# Patient Record
Sex: Female | Born: 1965 | Race: White | Hispanic: No | Marital: Married | Smoking: Never smoker
Health system: Southern US, Community
[De-identification: ages and names within clinical notes are randomized; demographics above are authoritative.]

## PROBLEM LIST (undated history)

## (undated) DIAGNOSIS — I10 Essential (primary) hypertension: Secondary | ICD-10-CM

## (undated) DIAGNOSIS — E119 Type 2 diabetes mellitus without complications: Secondary | ICD-10-CM

## (undated) HISTORY — PX: OTHER SURGICAL HISTORY: SHX169

---

## 2015-05-07 DIAGNOSIS — M778 Other enthesopathies, not elsewhere classified: Secondary | ICD-10-CM | POA: Insufficient documentation

## 2015-08-26 DIAGNOSIS — K219 Gastro-esophageal reflux disease without esophagitis: Secondary | ICD-10-CM | POA: Insufficient documentation

## 2015-08-26 DIAGNOSIS — I10 Essential (primary) hypertension: Secondary | ICD-10-CM | POA: Insufficient documentation

## 2018-03-21 DIAGNOSIS — M19012 Primary osteoarthritis, left shoulder: Secondary | ICD-10-CM | POA: Insufficient documentation

## 2018-03-21 DIAGNOSIS — M5030 Other cervical disc degeneration, unspecified cervical region: Secondary | ICD-10-CM | POA: Insufficient documentation

## 2018-05-23 DIAGNOSIS — I499 Cardiac arrhythmia, unspecified: Secondary | ICD-10-CM | POA: Insufficient documentation

## 2018-05-23 DIAGNOSIS — E782 Mixed hyperlipidemia: Secondary | ICD-10-CM | POA: Insufficient documentation

## 2018-05-23 DIAGNOSIS — E1165 Type 2 diabetes mellitus with hyperglycemia: Secondary | ICD-10-CM | POA: Insufficient documentation

## 2018-05-23 DIAGNOSIS — Z6841 Body Mass Index (BMI) 40.0 and over, adult: Secondary | ICD-10-CM | POA: Insufficient documentation

## 2018-07-03 ENCOUNTER — Encounter (HOSPITAL_BASED_OUTPATIENT_CLINIC_OR_DEPARTMENT_OTHER): Payer: Self-pay | Admitting: Emergency Medicine

## 2018-07-03 ENCOUNTER — Emergency Department (HOSPITAL_BASED_OUTPATIENT_CLINIC_OR_DEPARTMENT_OTHER): Payer: Commercial Managed Care - PPO

## 2018-07-03 ENCOUNTER — Other Ambulatory Visit: Payer: Self-pay

## 2018-07-03 ENCOUNTER — Emergency Department (HOSPITAL_BASED_OUTPATIENT_CLINIC_OR_DEPARTMENT_OTHER)
Admission: EM | Admit: 2018-07-03 | Discharge: 2018-07-04 | Disposition: A | Discharge status: Home / Self Care | Payer: Commercial Managed Care - PPO | Attending: Emergency Medicine | Admitting: Emergency Medicine

## 2018-07-03 DIAGNOSIS — E119 Type 2 diabetes mellitus without complications: Secondary | ICD-10-CM | POA: Insufficient documentation

## 2018-07-03 DIAGNOSIS — R0789 Other chest pain: Secondary | ICD-10-CM | POA: Diagnosis not present

## 2018-07-03 DIAGNOSIS — I10 Essential (primary) hypertension: Secondary | ICD-10-CM | POA: Insufficient documentation

## 2018-07-03 HISTORY — DX: Essential (primary) hypertension: I10

## 2018-07-03 HISTORY — DX: Type 2 diabetes mellitus without complications: E11.9

## 2018-07-03 LAB — BASIC METABOLIC PANEL
Anion gap: 8 (ref 5–15)
BUN: 18 mg/dL (ref 6–20)
CO2: 23 mmol/L (ref 22–32)
Calcium: 9.1 mg/dL (ref 8.9–10.3)
Chloride: 105 mmol/L (ref 98–111)
Creatinine, Ser: 0.49 mg/dL (ref 0.44–1.00)
GFR calc non Af Amer: >60 mL/min (ref >60)
Glucose, Bld: 116 mg/dL — ABNORMAL HIGH (ref 70–99)
Potassium: 3.3 mmol/L — ABNORMAL LOW (ref 3.5–5.1)
Sodium: 136 mmol/L (ref 135–145)

## 2018-07-03 LAB — CBC
HCT: 41.3 % (ref 36.0–46.0)
Hemoglobin: 12.8 g/dL (ref 12.0–15.0)
MCH: 27.3 pg (ref 26.0–34.0)
MCHC: 31 g/dL (ref 30.0–36.0)
MCV: 88.1 fL (ref 80.0–100.0)
Platelets: 199 10*3/uL (ref 150–400)
RBC: 4.69 MIL/uL (ref 3.87–5.11)
RDW: 14.2 % (ref 11.5–15.5)
WBC: 6.3 10*3/uL (ref 4.0–10.5)
nRBC: 0 % (ref 0.0–0.2)

## 2018-07-03 LAB — TROPONIN I: Troponin I: <0.03 ng/mL (ref <0.03)

## 2018-07-03 LAB — PREGNANCY, URINE: Preg Test, Ur: NEGATIVE

## 2018-07-03 MED ORDER — SODIUM CHLORIDE 0.9% FLUSH
3.0000 mL | Freq: Once | INTRAVENOUS | Status: DC
  Filled 2018-07-03: qty 3

## 2018-07-03 NOTE — ED Provider Notes (Signed)
[facility] EMERGENCY DEPARTMENT Provider Note   CSN: [id number] Arrival date & time: 07/03/18  2134    History   Chief Complaint Chief Complaint  Patient presents with  . Chest Pain    HPI Suzanne Brown is a 53 y.o. female.     53 yo F with a cc of chest pain.  Going on for the past 2 days.  Comes and goes.  Not sure what makes it better or worse.  Later thinks maybe its worse with eating.  Radiates to the neck.  No sob.   Denies history of MI has a history of hypertension hyperlipidemia and diabetes denies smoking history denies family history of MI.  Denies history of PE or DVT denies recent surgery or immobilization denies estrogen use denies history of cancer denies hemoptysis.  The history is provided by the patient.  Chest Pain  Pain location:  Substernal area Pain quality: aching and pressure   Pain radiates to:  Neck Pain severity:  Moderate Onset quality:  Gradual Duration:  2 days Timing:  Constant Progression:  Worsening Chronicity:  New Relieved by:  Nothing Worsened by:  Nothing Ineffective treatments:  None tried Associated symptoms: no dizziness, no fever, no headache, no nausea, no palpitations, no shortness of breath and no vomiting     Past Medical History:  Diagnosis Date  . Diabetes mellitus without complication (HCC)   . Hypertension     There are no active problems to display for this patient.   Past Surgical History:  Procedure Laterality Date  . c cection       OB History   No obstetric history on file.      Home Medications    Prior to Admission medications   Not on File    Family History History reviewed. No pertinent family history.  Social History Social History   Tobacco Use  . Smoking status: Never Smoker  . Smokeless tobacco: Never Used  Substance Use Topics  . Alcohol use: Never    Frequency: Never  . Drug use: Never     Allergies   Sulfa antibiotics   Review of Systems Review of Systems   Constitutional: Negative for chills and fever.  HENT: Negative for congestion and rhinorrhea.   Eyes: Negative for redness and visual disturbance.  Respiratory: Negative for shortness of breath and wheezing.   Cardiovascular: Positive for chest pain. Negative for palpitations.  Gastrointestinal: Negative for nausea and vomiting.  Genitourinary: Negative for dysuria and urgency.  Musculoskeletal: Negative for arthralgias and myalgias.  Skin: Negative for pallor and wound.  Neurological: Negative for dizziness and headaches.     Physical Exam Updated Vital Signs BP 140/85   Pulse 73   Temp 98 F (36.7 C) (Oral)   Resp 19   Ht 5\' 5"  (1.651 m)   Wt 113.4 kg   SpO2 97%   BMI 41.60 kg/m   Physical Exam Vitals signs and nursing note reviewed.  Constitutional:      General: She is not in acute distress.    Appearance: She is well-developed. She is not diaphoretic.  HENT:     Head: Normocephalic and atraumatic.  Eyes:     Pupils: Pupils are equal, round, and reactive to light.  Neck:     Musculoskeletal: Normal range of motion and neck supple.  Cardiovascular:     Rate and Rhythm: Normal rate and regular rhythm.     Heart sounds: No murmur. No friction rub. No gallop.  Pulmonary:     Effort: Pulmonary effort is normal.     Breath sounds: No wheezing or rales.  Abdominal:     General: There is no distension.     Palpations: Abdomen is soft.     Tenderness: There is no abdominal tenderness.  Musculoskeletal:        General: No tenderness.  Skin:    General: Skin is warm and dry.  Neurological:     Mental Status: She is alert and oriented to person, place, and time.  Psychiatric:        Behavior: Behavior normal.      ED Treatments / Results  Labs (all labs ordered are listed, but only abnormal results are displayed) Labs Reviewed  BASIC METABOLIC PANEL - Abnormal; Notable for the following components:      Result Value   Potassium 3.3 (*)    Glucose, Bld 116  (*)    All other components within normal limits  CBC  TROPONIN I  PREGNANCY, URINE  TROPONIN I    EKG EKG Interpretation  Date/Time:  Monday July 03 2018 21:41:53 EST Ventricular Rate:  82 PR Interval:  164 QRS Duration: 78 QT Interval:  382 QTC Calculation: 446 R Axis:   66 Text Interpretation:  Normal sinus rhythm Nonspecific T wave abnormality Abnormal ECG Confirmed by Geoffery Lyons ([id number]) on 07/03/2018 10:25:39 PM   Radiology Dg Chest 2 View  Result Date: 07/03/2018 CLINICAL DATA:  Initial evaluation for acute chest pain. EXAM: CHEST - 2 VIEW COMPARISON:  None. FINDINGS: Mild cardiomegaly.  Mediastinal silhouette normal. Lungs normally inflated. Mild linear atelectasis and/or scarring within the mid and lower left lung. No focal infiltrates. No edema or effusion. No pneumothorax. No acute osseous finding. IMPRESSION: 1. Minimal subsegmental atelectasis and/or scarring within the mid and lower left lung. 2. No other active cardiopulmonary disease. Electronically Signed   By: Rise Mu M.D.   On: 07/03/2018 22:07    Procedures Procedures (including critical care time)  Medications Ordered in ED Medications  sodium chloride flush (NS) 0.9 % injection 3 mL (3 mLs Intravenous Not Given 07/03/18 2222)     Initial Impression / Assessment and Plan / ED Course  I have reviewed the triage vital signs and the nursing notes.  Pertinent labs & imaging results that were available during my care of the patient were reviewed by me and considered in my medical decision making (see chart for details).        53 yo F with a chief complaint of chest pain.  Atypical in nature going on for the past 2 or 3 days.  Initial troponin is negative EKG is unremarkable.  Chest x-ray reviewed by me without focal infiltrate.  She has very mild hypokalemia at 3.3.  Her delta troponin is negative.  Patient continues to feel well.  Discharge home.  2:13 AM:  I have discussed the  diagnosis/risks/treatment options with the patient and believe the pt to be eligible for discharge home to follow-up with PCP. We also discussed returning to the ED immediately if new or worsening sx occur. We discussed the sx which are most concerning (e.g., sudden worsening pain, fever, inability to tolerate by mouth) that necessitate immediate return. Medications administered to the patient during their visit and any new prescriptions provided to the patient are listed below.  Medications given during this visit Medications  sodium chloride flush (NS) 0.9 % injection 3 mL (3 mLs Intravenous Not Given 07/03/18 2222)  The patient appears reasonably screen and/or stabilized for discharge and I doubt any other medical condition or other [facility] requiring further screening, evaluation, or treatment in the ED at this time prior to discharge.    Final Clinical Impressions(s) / ED Diagnoses   Final diagnoses:  Atypical chest pain    ED Discharge Orders    None       Melene Plan, DO 07/04/18 [id number]

## 2018-07-03 NOTE — ED Triage Notes (Signed)
Pt c/o 5/10 mid cp radiating to her neck and shoulder. Denies any nausea or vomiting.

## 2018-07-03 NOTE — ED Notes (Signed)
Pt ambulatory to bathroom for urine sample.

## 2018-07-04 LAB — TROPONIN I

## 2018-07-04 NOTE — Discharge Instructions (Signed)
Try zantac or pepcid twice a day.  Try to avoid things that may make this worse, most commonly these are spicy foods tomato based products fatty foods chocolate and peppermint.  Alcohol and tobacco can also make this worse.  Return to the emergency department for sudden worsening pain fever or inability to eat or drink. ° °

## 2018-10-31 DIAGNOSIS — F321 Major depressive disorder, single episode, moderate: Secondary | ICD-10-CM | POA: Insufficient documentation

## 2019-06-03 ENCOUNTER — Other Ambulatory Visit (HOSPITAL_COMMUNITY): Payer: Self-pay | Admitting: Physician Assistant

## 2019-06-03 DIAGNOSIS — E119 Type 2 diabetes mellitus without complications: Secondary | ICD-10-CM

## 2019-06-03 DIAGNOSIS — U071 COVID-19: Secondary | ICD-10-CM

## 2019-06-03 DIAGNOSIS — I10 Essential (primary) hypertension: Secondary | ICD-10-CM

## 2019-06-03 NOTE — Progress Notes (Signed)
  I connected by phone with Suzanne Brown on 06/03/2019 at 11:19 AM to discuss the potential use of an new treatment for mild to moderate COVID-19 viral infection in non-hospitalized patients.  This patient is a 54 y.o. female that meets the FDA criteria for Emergency Use Authorization of bamlanivimab or casirivimab\imdevimab.  Has a (+) direct SARS-CoV-2 viral test result  Has mild or moderate COVID-19   Is ? 55 years of age and weighs ? 40 kg  Is NOT hospitalized due to COVID-19  Is NOT requiring oxygen therapy or requiring an increase in baseline oxygen flow rate due to COVID-19  Is within 10 days of symptom onset  Has at least one of the high risk factor(s) for progression to severe COVID-19 and/or hospitalization as defined in EUA.  Specific high risk criteria : Diabetes   Hx of HTN and BMI of 46.59   I have spoken and communicated the following to the patient or parent/caregiver:  1. FDA has authorized the emergency use of bamlanivimab and casirivimab\imdevimab for the treatment of mild to moderate COVID-19 in adults and pediatric patients with positive results of direct SARS-CoV-2 viral testing who are 82 years of age and older weighing at least 40 kg, and who are at high risk for progressing to severe COVID-19 and/or hospitalization.  2. The significant known and potential risks and benefits of bamlanivimab and casirivimab\imdevimab, and the extent to which such potential risks and benefits are unknown.  3. Information on available alternative treatments and the risks and benefits of those alternatives, including clinical trials.  4. Patients treated with bamlanivimab and casirivimab\imdevimab should continue to self-isolate and use infection control measures (e.g., wear mask, isolate, social distance, avoid sharing personal items, clean and disinfect "high touch" surfaces, and frequent handwashing) according to CDC guidelines.   5. The patient or parent/caregiver has the  option to accept or refuse bamlanivimab or casirivimab\imdevimab .  After reviewing this information with the patient, The patient agreed to proceed with receiving the bamlanimivab infusion and will be provided a copy of the Fact sheet prior to receiving the infusion.   [name] 06/03/2019 11:19 AM

## 2019-06-04 ENCOUNTER — Ambulatory Visit (HOSPITAL_COMMUNITY)
Admission: RE | Admit: 2019-06-04 | Discharge: 2019-06-04 | Disposition: A | Discharge status: Home / Self Care | Payer: Commercial Managed Care - PPO | Source: Ambulatory Visit | Attending: Pulmonary Disease | Admitting: Pulmonary Disease

## 2019-06-04 DIAGNOSIS — U071 COVID-19: Secondary | ICD-10-CM | POA: Diagnosis not present

## 2019-06-04 DIAGNOSIS — Z23 Encounter for immunization: Secondary | ICD-10-CM | POA: Insufficient documentation

## 2019-06-04 MED ORDER — EPINEPHRINE 0.3 MG/0.3ML IJ SOAJ: 0.3000 mg | Freq: Once | INTRAMUSCULAR | Status: DC | PRN

## 2019-06-04 MED ORDER — SODIUM CHLORIDE 0.9 % IV SOLN: INTRAVENOUS | Status: DC | PRN

## 2019-06-04 MED ORDER — SODIUM CHLORIDE 0.9 % IV SOLN
700.0000 mg | Freq: Once | INTRAVENOUS | Status: AC
  Administered 2019-06-04: 700 mg via INTRAVENOUS
  Filled 2019-06-04: qty 20

## 2019-06-04 MED ORDER — ACETAMINOPHEN 325 MG PO TABS
650.0000 mg | ORAL_TABLET | Freq: Once | ORAL | Status: AC
  Administered 2019-06-04: 650 mg via ORAL

## 2019-06-04 MED ORDER — METHYLPREDNISOLONE SODIUM SUCC 125 MG IJ SOLR: 125.0000 mg | Freq: Once | INTRAMUSCULAR | Status: DC | PRN

## 2019-06-04 MED ORDER — FAMOTIDINE IN NACL 20-0.9 MG/50ML-% IV SOLN: 20.0000 mg | Freq: Once | INTRAVENOUS | Status: DC | PRN

## 2019-06-04 MED ORDER — ACETAMINOPHEN 325 MG PO TABS
ORAL_TABLET | ORAL | Status: AC
  Filled 2019-06-04: qty 2

## 2019-06-04 MED ORDER — ALBUTEROL SULFATE HFA 108 (90 BASE) MCG/ACT IN AERS: 2.0000 | INHALATION_SPRAY | Freq: Once | RESPIRATORY_TRACT | Status: DC | PRN

## 2019-06-04 MED ORDER — DIPHENHYDRAMINE HCL 50 MG/ML IJ SOLN: 50.0000 mg | Freq: Once | INTRAMUSCULAR | Status: DC | PRN

## 2019-06-04 NOTE — Discharge Instructions (Signed)

## 2019-06-04 NOTE — Progress Notes (Signed)
  Diagnosis: COVID-19  Physician: Dr.Wright   Procedure: [facility] Med: bamlanivimab infusion - Provided patient with bamlanimivab fact sheet for patients, parents and caregivers prior to infusion.  Complications: No immediate complications noted.  Did receive Tylenol for pain   Discharge: Discharged home   Suzanne Brown 06/04/2019

## 2019-06-05 DIAGNOSIS — U071 COVID-19: Secondary | ICD-10-CM | POA: Insufficient documentation

## 2019-08-07 DIAGNOSIS — M25561 Pain in right knee: Secondary | ICD-10-CM | POA: Insufficient documentation

## 2020-04-22 DIAGNOSIS — M65321 Trigger finger, right index finger: Secondary | ICD-10-CM | POA: Insufficient documentation

## 2020-04-29 ENCOUNTER — Other Ambulatory Visit: Payer: Self-pay

## 2020-04-29 ENCOUNTER — Ambulatory Visit: Payer: Commercial Managed Care - PPO | Admitting: Podiatry

## 2020-04-29 DIAGNOSIS — R234 Changes in skin texture: Secondary | ICD-10-CM | POA: Diagnosis not present

## 2020-04-29 DIAGNOSIS — M21969 Unspecified acquired deformity of unspecified lower leg: Secondary | ICD-10-CM

## 2020-04-29 DIAGNOSIS — E119 Type 2 diabetes mellitus without complications: Secondary | ICD-10-CM

## 2020-04-29 MED ORDER — AMMONIUM LACTATE 12 % EX CREA: TOPICAL_CREAM | CUTANEOUS | 0 refills | Status: AC | PRN

## 2020-04-29 MED ORDER — MUPIROCIN 2 % EX OINT: 1.0000 "application " | TOPICAL_OINTMENT | Freq: Two times a day (BID) | CUTANEOUS | 2 refills | Status: AC

## 2020-04-29 NOTE — Patient Instructions (Signed)
Diabetes Mellitus and Foot Care Foot care is an important part of your health, especially when you have diabetes. Diabetes may cause you to have problems because of poor blood flow (circulation) to your feet and legs, which can cause your skin to:  Become thinner and drier.  Break more easily.  Heal more slowly.  Peel and crack. You may also have nerve damage (neuropathy) in your legs and feet, causing decreased feeling in them. This means that you may not notice minor injuries to your feet that could lead to more serious problems. Noticing and addressing any potential problems early is the best way to prevent future foot problems. How to care for your feet Foot hygiene  Wash your feet daily with warm water and mild soap. Do not use hot water. Then, pat your feet and the areas between your toes until they are completely dry. Do not soak your feet as this can dry your skin.  Trim your toenails straight across. Do not dig under them or around the cuticle. File the edges of your nails with an emery board or nail file.  Apply a moisturizing lotion or petroleum jelly to the skin on your feet and to dry, brittle toenails. Use lotion that does not contain alcohol and is unscented. Do not apply lotion between your toes. Shoes and socks  Wear clean socks or stockings every day. Make sure they are not too tight. Do not wear knee-high stockings since they may decrease blood flow to your legs.  Wear shoes that fit properly and have enough cushioning. Always look in your shoes before you put them on to be sure there are no objects inside.  To break in new shoes, wear them for just a few hours a day. This prevents injuries on your feet. Wounds, scrapes, corns, and calluses  Check your feet daily for blisters, cuts, bruises, sores, and redness. If you cannot see the bottom of your feet, use a mirror or ask someone for help.  Do not cut corns or calluses or try to remove them with medicine.  If you  find a minor scrape, cut, or break in the skin on your feet, keep it and the skin around it clean and dry. You may clean these areas with mild soap and water. Do not clean the area with peroxide, alcohol, or iodine.  If you have a wound, scrape, corn, or callus on your foot, look at it several times a day to make sure it is healing and not infected. Check for: ? Redness, swelling, or pain. ? Fluid or blood. ? Warmth. ? Pus or a bad smell. General instructions  Do not cross your legs. This may decrease blood flow to your feet.  Do not use heating pads or hot water bottles on your feet. They may burn your skin. If you have lost feeling in your feet or legs, you may not know this is happening until it is too late.  Protect your feet from hot and cold by wearing shoes, such as at the beach or on hot pavement.  Schedule a complete foot exam at least once a year (annually) or more often if you have foot problems. If you have foot problems, report any cuts, sores, or bruises to your health care provider immediately. Contact a health care provider if:  You have a medical condition that increases your risk of infection and you have any cuts, sores, or bruises on your feet.  You have an injury that is not   healing.  You have redness on your legs or feet.  You feel burning or tingling in your legs or feet.  You have pain or cramps in your legs and feet.  Your legs or feet are numb.  Your feet always feel cold.  You have pain around a toenail. Get help right away if:  You have a wound, scrape, corn, or callus on your foot and: ? You have pain, swelling, or redness that gets worse. ? You have fluid or blood coming from the wound, scrape, corn, or callus. ? Your wound, scrape, corn, or callus feels warm to the touch. ? You have pus or a bad smell coming from the wound, scrape, corn, or callus. ? You have a fever. ? You have a red line going up your leg. Summary  Check your feet every day  for cuts, sores, red spots, swelling, and blisters.  Moisturize feet and legs daily.  Wear shoes that fit properly and have enough cushioning.  If you have foot problems, report any cuts, sores, or bruises to your health care provider immediately.  Schedule a complete foot exam at least once a year (annually) or more often if you have foot problems. This information is not intended to replace advice given to you by your health care provider. Make sure you discuss any questions you have with your health care provider. Document Revised: 01/10/2019 Document Reviewed: 05/21/2016 Elsevier Patient Education  2020 Elsevier Inc.  

## 2020-05-02 NOTE — Progress Notes (Signed)
Subjective:   Patient ID: Suzanne Brown, female   DOB: 54 y.o.   MRN: [id number]   HPI 54 year old female presents the office today for concerns about foot pain on calluses to both of her feet.  She said the left side worse than right.  She states that she has tried soaking hydrogen peroxide.  She previously has another doctor where the calluses were trimmed which did help some.  She denies any ulcerations.  Occasionally calluses will crack open.  Denies any edema, erythema.  No claudication symptoms or neuropathy symptoms.   Review of Systems  All other systems reviewed and are negative.  Past Medical History:  Diagnosis Date  . Diabetes mellitus without complication (HCC)   . Hypertension     Past Surgical History:  Procedure Laterality Date  . c cection       Current Outpatient Medications:  .  ammonium lactate (AMLACTIN) 12 % cream, Apply topically as needed for dry skin., Disp: 385 g, Rfl: 0 .  busPIRone (BUSPAR) 10 MG tablet, Take by mouth., Disp: , Rfl:  .  clotrimazole-betamethasone (LOTRISONE) cream, Apply to affected area once PRN., Disp: , Rfl:  .  losartan-hydrochlorothiazide (HYZAAR) 100-12.5 MG tablet, TK 1 T PO  QD, Disp: , Rfl:  .  metFORMIN (GLUCOPHAGE-XR) 500 MG 24 hr tablet, Take 2 tablets by mouth every evening., Disp: , Rfl:  .  mupirocin ointment (BACTROBAN) 2 %, Apply 1 application topically 2 (two) times daily., Disp: 30 g, Rfl: 2 .  olmesartan-hydrochlorothiazide (BENICAR HCT) 40-12.5 MG tablet, Take 1 tablet by mouth daily., Disp: , Rfl:  .  rosuvastatin (CRESTOR) 10 MG tablet, rosuvastatin 10 mg tablet, Disp: , Rfl:  .  acetaminophen (TYLENOL) 500 MG tablet, Take by mouth., Disp: , Rfl:  .  diclofenac (VOLTAREN) 75 MG EC tablet, diclofenac sodium 75 mg tablet,delayed release, Disp: , Rfl:  .  Multiple Vitamin (QUINTABS) TABS, Take 1 tablet by mouth daily., Disp: , Rfl:   Allergies  Allergen Reactions  . Fluoxetine Hives  . Sulfa Antibiotics           Objective:  Physical Exam  General: AAO x3, NAD  Dermatological: Hyperkeratotic lesions bilateral submetatarsal 1 and 5.  Left foot submetatarsal 5 there is a skin fissure present with melanotic relation tissue there is no drainage or pus.  No edema, erythema.  There is no probing, undermining or tunneling.  Dry skin present to the plantar feet bilaterally.  There are no open sores, no preulcerative lesions, no rash or signs of infection present.  Vascular: Dorsalis Pedis artery and Posterior Tibial artery pedal pulses are 2/4 bilateral with immedate capillary fill time.There is no pain with calf compression, swelling, warmth, erythema.   Neruologic: Grossly intact via light touch bilateral.  Musculoskeletal: Prominence of metatarsal heads plantarly.  Muscular strength 5/5 in all groups tested bilateral.  Gait: Unassisted, Nonantalgic.       Assessment:   Hyperkeratotic lesion, skin fissure    Plan:  -Treatment options discussed including all alternatives, risks, and complications -Etiology of symptoms were discussed -As a courtesy I debrided the calluses with any complications or bleeding.  Recommended antibiotic ointment for the left skin fissure.  Once healed can use moisturizer.  Dispensed metatarsal offloading pads.  Discussed inserts. -Monitor for any clinical signs or symptoms of infection and directed to call the office immediately should any occur or go to the ER.  Vivi Barrack DPM

## 2020-05-16 ENCOUNTER — Ambulatory Visit: Payer: Commercial Managed Care - PPO | Admitting: Podiatry

## 2020-06-24 IMAGING — DX DG CHEST 2V
3 series · 3 of 3 positions shown · non-contrast
Comparison: None.

CLINICAL DATA: Initial evaluation for acute chest pain.

EXAM:
CHEST - 2 VIEW

[chest pa (1 of 2)]
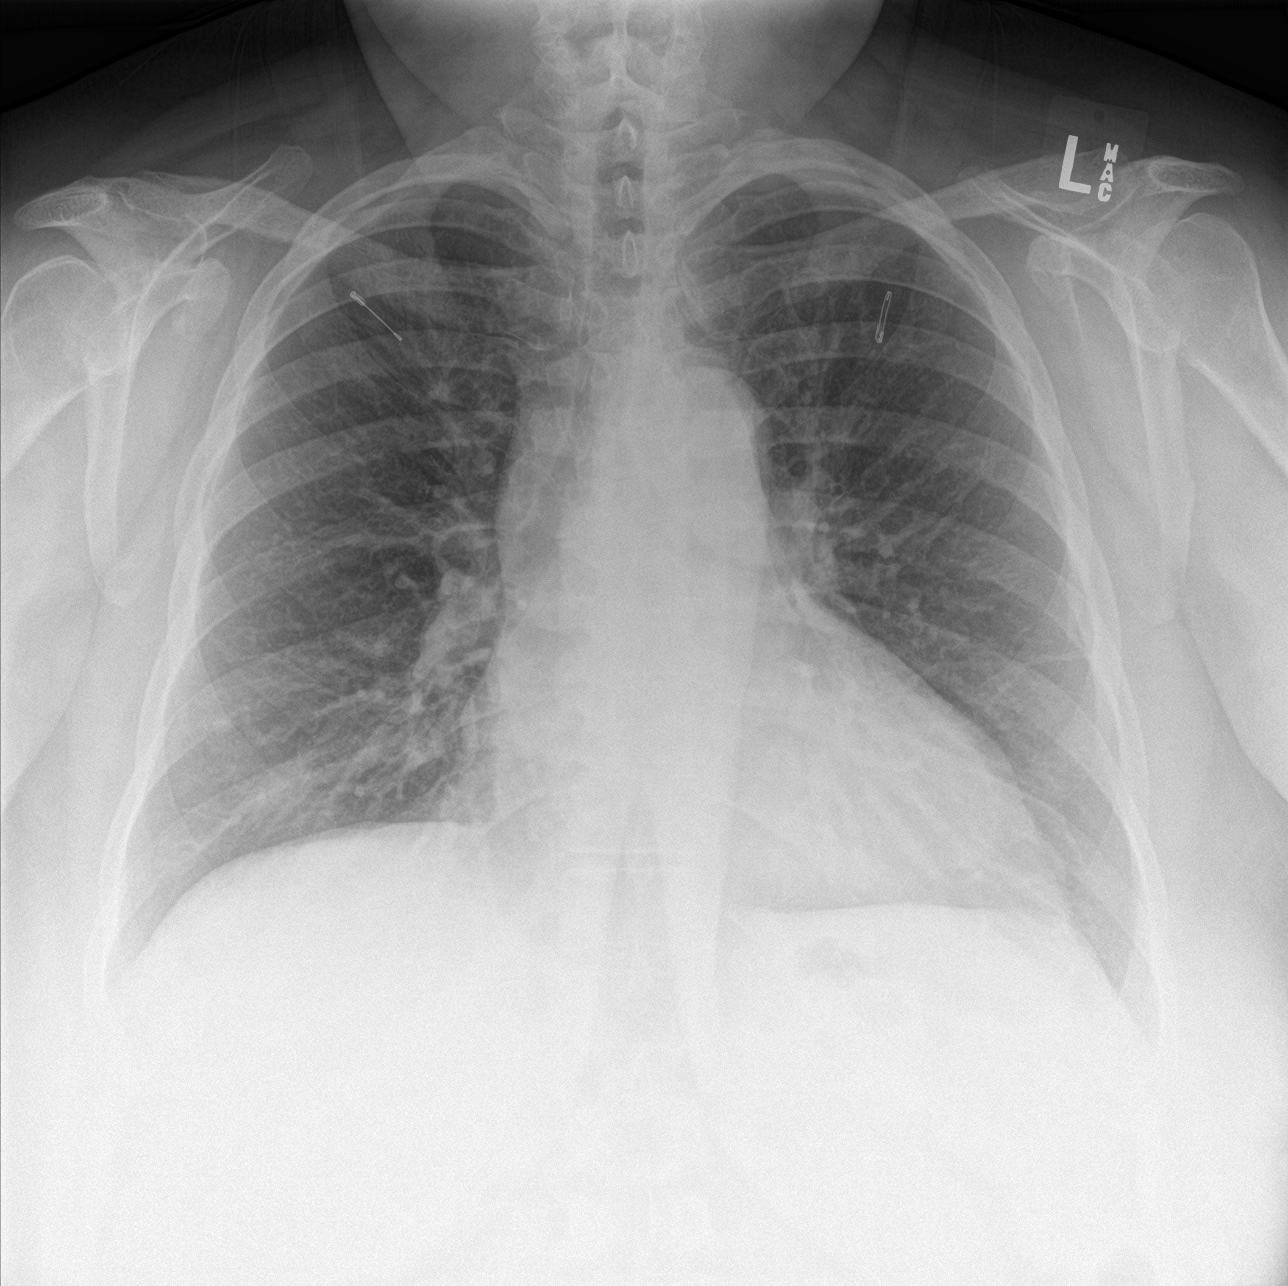

[chest lat]
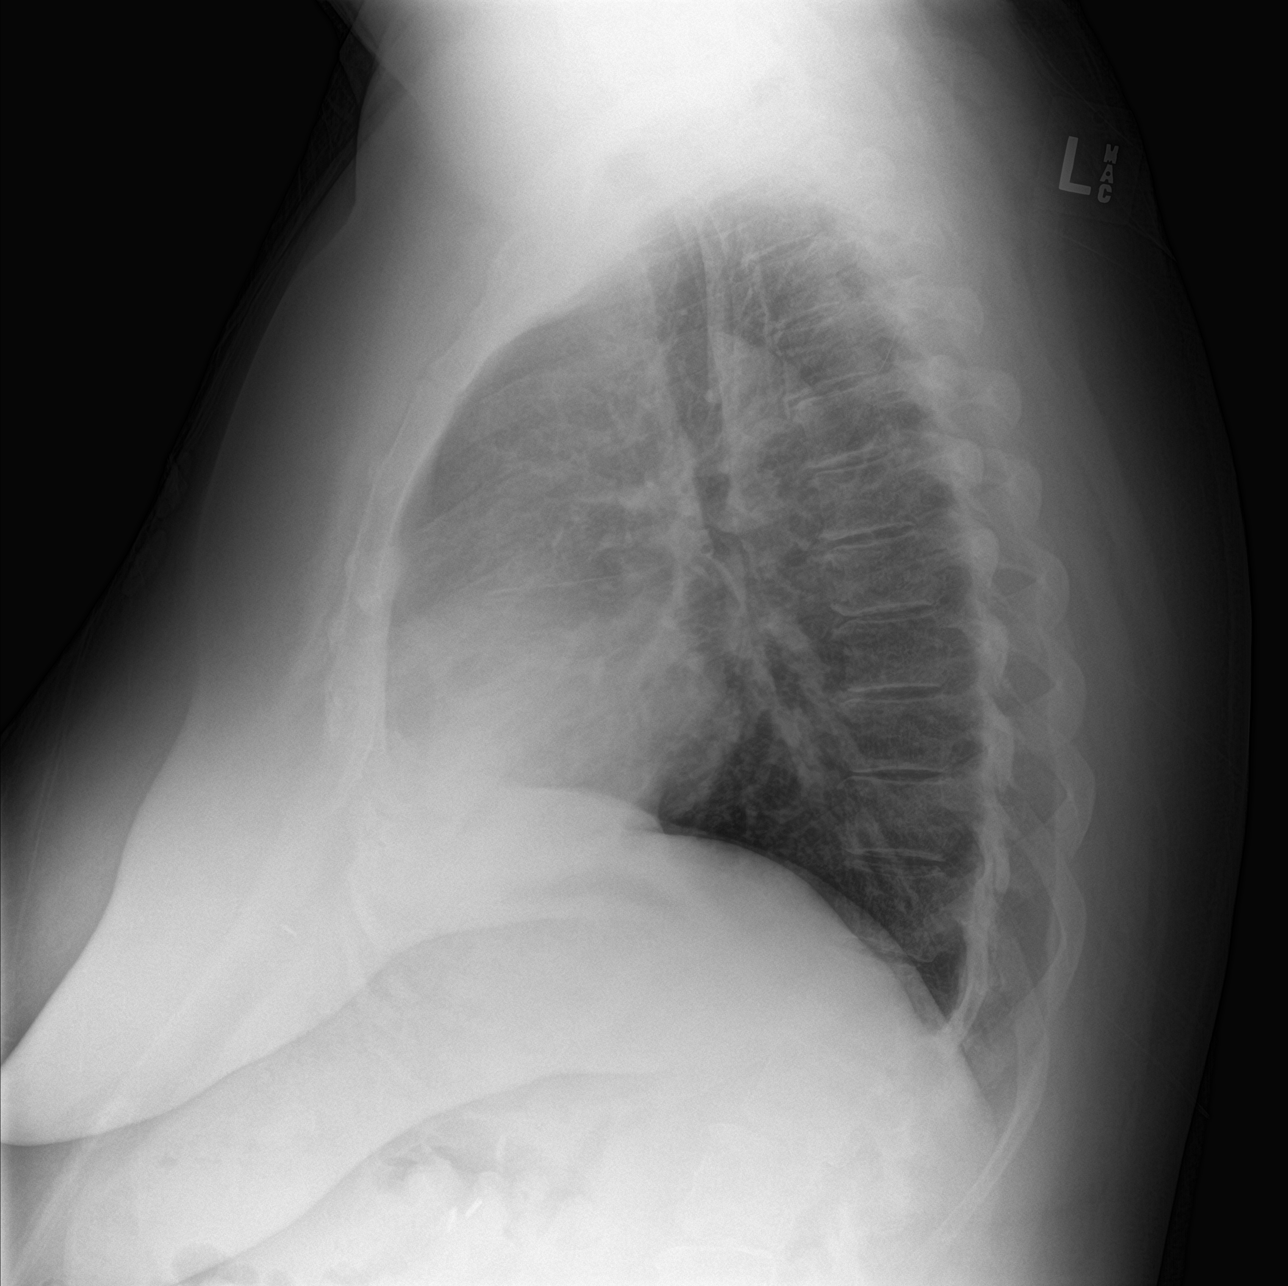

[chest pa (2 of 2)]
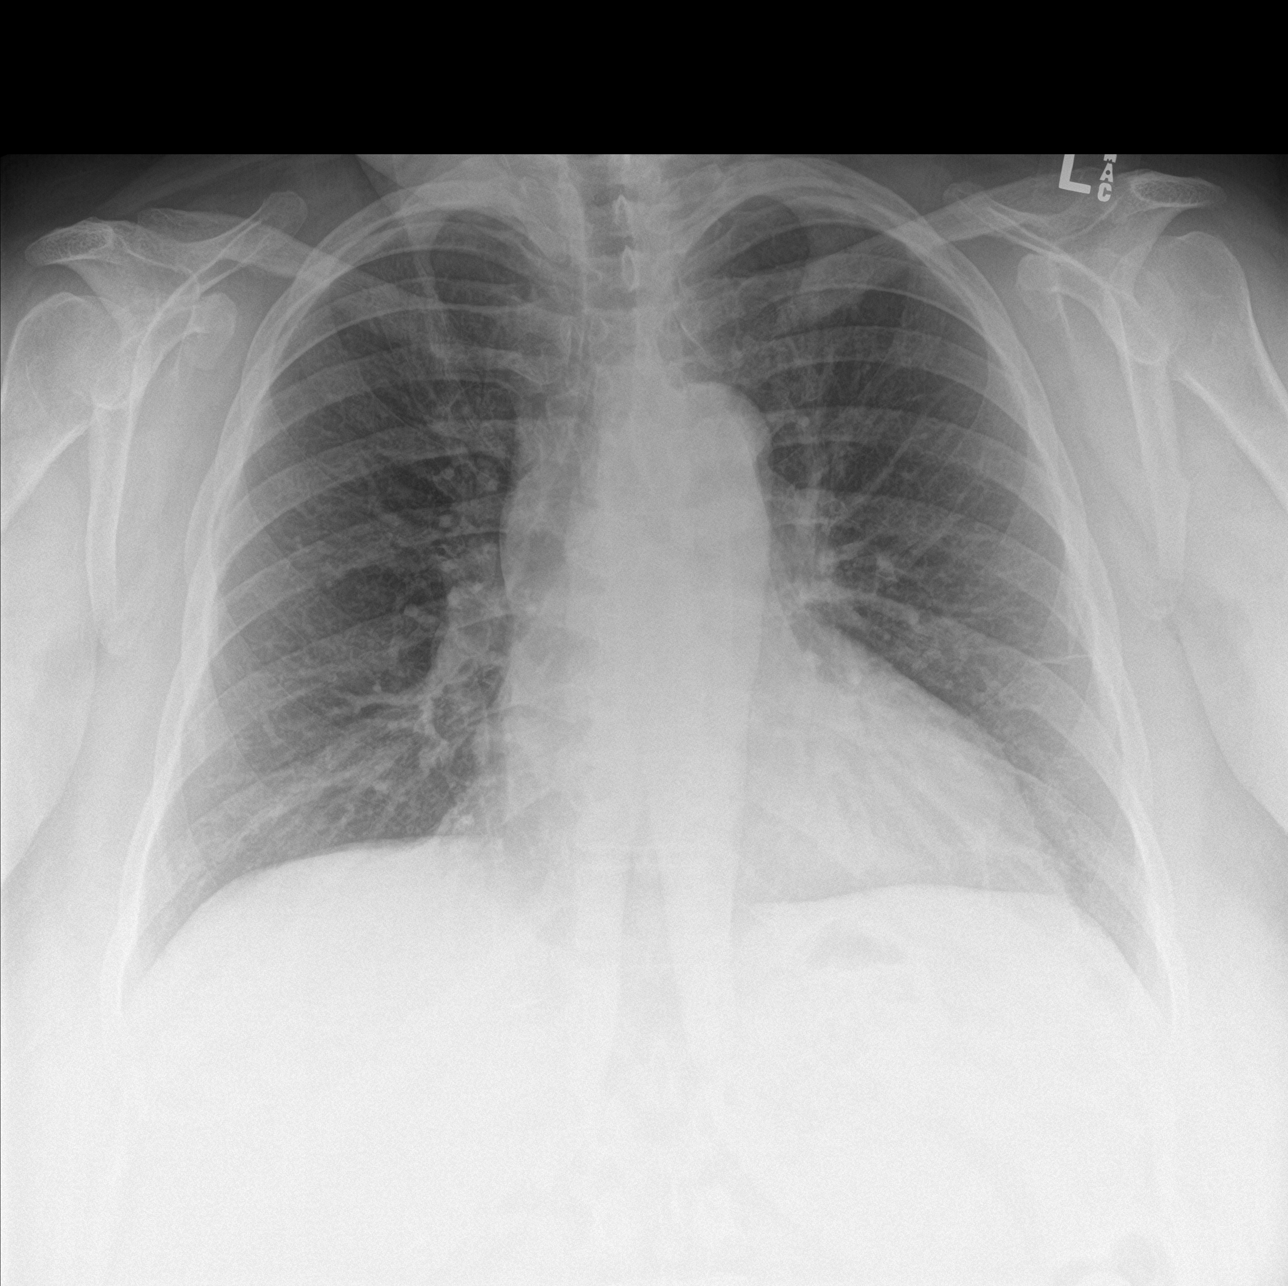

[3 of 3 positions shown; findings below may reference images not displayed]

FINDINGS: Mild cardiomegaly.  Mediastinal silhouette normal.

Lungs normally inflated. Mild linear atelectasis and/or scarring
within the mid and lower left lung. No focal infiltrates. No edema
or effusion. No pneumothorax.

No acute osseous finding.
IMPRESSION: 1. Minimal subsegmental atelectasis and/or scarring within the mid
and lower left lung.
2. No other active cardiopulmonary disease.

## 2020-07-29 ENCOUNTER — Ambulatory Visit: Payer: Commercial Managed Care - PPO | Admitting: Podiatry

## 2020-08-18 ENCOUNTER — Other Ambulatory Visit: Payer: Self-pay

## 2020-08-18 ENCOUNTER — Ambulatory Visit: Payer: Commercial Managed Care - PPO | Admitting: Podiatrist

## 2020-08-18 DIAGNOSIS — M216X9 Other acquired deformities of unspecified foot: Secondary | ICD-10-CM

## 2020-08-18 DIAGNOSIS — E11628 Type 2 diabetes mellitus with other skin complications: Secondary | ICD-10-CM | POA: Diagnosis not present

## 2020-08-18 DIAGNOSIS — L84 Corns and callosities: Secondary | ICD-10-CM | POA: Diagnosis not present

## 2020-08-18 DIAGNOSIS — R234 Changes in skin texture: Secondary | ICD-10-CM

## 2020-08-18 NOTE — Patient Instructions (Signed)
Corns and Calluses Corns are small areas of thickened skin that form on the top, sides, or tip of a toe. Corns have a cone-shaped core with a point that can press on a nerve below. This causes pain. Calluses are areas of thickened skin that can form anywhere on the body, including the hands, fingers, palms, soles of the feet, and heels. Calluses are usually larger than corns. What are the causes? Corns and calluses are caused by rubbing (friction) or pressure, such as from shoes that are too tight or do not fit properly. What increases the risk? Corns are more likely to develop in people who have misshapen toes (toe deformities), such as hammer toes. Calluses can form with friction to any area of the skin. They are more likely to develop in people who:  Work with their hands.  Wear shoes that fit poorly, are too tight, or are high-heeled.  Have toe deformities. What are the signs or symptoms? Symptoms of a corn or callus include:  A hard growth on the skin.  Pain or tenderness under the skin.  Redness and swelling.  Increased discomfort while wearing tight-fitting shoes, if your feet are affected. If a corn or callus becomes infected, symptoms may include:  Redness and swelling that gets worse.  Pain.  Fluid, blood, or pus draining from the corn or callus.   How is this diagnosed? Corns and calluses may be diagnosed based on your symptoms, your medical history, and a physical exam. How is this treated? Treatment for corns and calluses may include:  Removing the cause of the friction or pressure. This may involve: ? Changing your shoes. ? Wearing shoe inserts (orthotics) or other protective layers in your shoes, such as a corn pad. ? Wearing gloves.  Applying medicine to the skin (topical medicine) to help soften skin in the hardened, thickened areas.  Removing layers of dead skin with a file to reduce the size of the corn or callus.  Removing the corn or callus with a  scalpel or laser.  Taking antibiotic medicines, if your corn or callus is infected.  Having surgery, if a toe deformity is the cause. Follow these instructions at home:  Take over-the-counter and prescription medicines only as told by your health care provider.  If you were prescribed an antibiotic medicine, take it as told by your health care provider. Do not stop taking it even if your condition improves.  Wear shoes that fit well. Avoid wearing high-heeled shoes and shoes that are too tight or too loose.  Wear any padding, protective layers, gloves, or orthotics as told by your health care provider.  Soak your hands or feet. Then use a file or pumice stone to soften your corn or callus. Do this as told by your health care provider.  Check your corn or callus every day for signs of infection.   Contact a health care provider if:  Your symptoms do not improve with treatment.  You have redness or swelling that gets worse.  Your corn or callus becomes painful.  You have fluid, blood, or pus coming from your corn or callus.  You have new symptoms. Get help right away if:  You develop severe pain with redness. Summary  Corns are small areas of thickened skin that form on the top, sides, or tip of a toe. These can be painful.  Calluses are areas of thickened skin that can form anywhere on the body, including the hands, fingers, palms, and soles of the   feet. Calluses are usually larger than corns.  Corns and calluses are caused by rubbing (friction) or pressure, such as from shoes that are too tight or do not fit properly.  Treatment may include wearing padding, protective layers, gloves, or orthotics as told by your health care provider. This information is not intended to replace advice given to you by your health care provider. Make sure you discuss any questions you have with your health care provider. Document Revised: 08/16/2019 Document Reviewed: 08/16/2019 Elsevier  Patient Education  2021 [employer].  

## 2020-08-19 NOTE — Progress Notes (Signed)
Chief Complaint  Patient presents with  . Callouses    Pt states she has bilateral plantar calloses.      HPI: Patient is 55 y.o. female who presents today for the concerns as listed above. She relates no new changes.  States her calluses are painful when she walks and she needs them trimmed periodically.    Patient Active Problem List   Diagnosis Date Noted  . Acquired trigger finger of right index finger 04/22/2020  . Pain in right knee 08/07/2019  . COVID-19 06/05/2019  . Current moderate episode of major depressive disorder without prior episode (HCC) 10/31/2018  . Irregular heart rate 05/23/2018  . Mixed hyperlipidemia 05/23/2018  . Morbid obesity with body mass index (BMI) of 40.0 to 44.9 in adult [facility]) 05/23/2018  . Type 2 diabetes mellitus with hyperglycemia, without long-term current use of insulin (HCC) 05/23/2018  . Arthritis of left acromioclavicular joint 03/21/2018  . DDD (degenerative disc disease), cervical 03/21/2018  . Benign essential HTN 08/26/2015  . Gastroesophageal reflux disease without esophagitis 08/26/2015  . Morbid obesity (HCC) 05/07/2015  . Tendinitis of flexor tendon of left hand 05/07/2015    Current Outpatient Medications on File Prior to Visit  Medication Sig Dispense Refill  . acetaminophen (TYLENOL) 500 MG tablet Take by mouth.    Marland Kitchen ammonium lactate (AMLACTIN) 12 % cream Apply topically as needed for dry skin. 385 g 0  . busPIRone (BUSPAR) 10 MG tablet Take by mouth.    . clotrimazole-betamethasone (LOTRISONE) cream Apply to affected area once PRN.    . diclofenac (VOLTAREN) 75 MG EC tablet diclofenac sodium 75 mg tablet,delayed release    . losartan-hydrochlorothiazide (HYZAAR) 100-12.5 MG tablet TK 1 T PO  QD    . metFORMIN (GLUCOPHAGE-XR) 500 MG 24 hr tablet Take 2 tablets by mouth every evening.    . Multiple Vitamin (QUINTABS) TABS Take 1 tablet by mouth daily.    . mupirocin ointment (BACTROBAN) 2 % Apply 1 application topically 2 (two)  times daily. 30 g 2  . olmesartan-hydrochlorothiazide (BENICAR HCT) 40-12.5 MG tablet Take 1 tablet by mouth daily.    . rosuvastatin (CRESTOR) 10 MG tablet rosuvastatin 10 mg tablet     No current facility-administered medications on file prior to visit.    Allergies  Allergen Reactions  . Fluoxetine Hives  . Sulfa Antibiotics     Physical Exam  General: AAO x3, NAD   Dermatological: Hyperkeratotic lesions bilateral submetatarsal 1 and 5.  Left foot submetatarsal 5 there is a skin fissure present with hyperkeratotic rim of tissue present along the skin crease.  there is no drainage or pus.  No edema, erythema.  There is no probing, undermining or tunneling.  Dry skin present to the plantar feet bilaterally.  There are no open sores, no preulcerative lesions, no rash or signs of infection present.   Vascular: Dorsalis Pedis artery and Posterior Tibial artery pedal pulses are 2/4 bilateral with immedate capillary fill time.There is no pain with calf compression, swelling, warmth, erythema.    Neruologic: Grossly intact via light touch bilateral.   Musculoskeletal: Prominence of metatarsal heads plantarly.  Muscular strength 5/5 in all groups tested bilateral.   Gait: Unassisted, Nonantalgic.  Assessment     ICD-10-CM   1. Type 2 diabetes mellitus with pressure callus (HCC)  E11.628    L84   2. Skin fissure  R23.4   3. Plantar flexed metatarsal, unspecified laterality  M21.6X9      Plan  The  lesions were pared with a 15 blade to a safe level with no iatrogenic bleeding post debridement.  She was advised to continue with a soft supportive shoe She will return in 3 months for continued care of lesions.

## 2020-08-23 ENCOUNTER — Encounter: Payer: Self-pay | Admitting: Podiatrist

## 2020-11-24 ENCOUNTER — Ambulatory Visit: Payer: Commercial Managed Care - PPO | Admitting: Podiatry

## 2022-04-07 ENCOUNTER — Ambulatory Visit: Payer: Commercial Managed Care - PPO | Admitting: Podiatry

## 2022-04-07 ENCOUNTER — Encounter: Payer: Self-pay | Admitting: Podiatry

## 2022-04-07 VITALS — BP 155/80 | HR 99

## 2022-04-07 DIAGNOSIS — E119 Type 2 diabetes mellitus without complications: Secondary | ICD-10-CM

## 2022-04-07 NOTE — Progress Notes (Signed)
   Chief Complaint  Patient presents with   foot care    Patient is her for diabetic foot care/ callous on left foot.    Subjective: 55 y.o. female PMHx diabetes mellitus type 2 presenting to the office today for annual diabetic foot exam.  Patient also states that she has some calluses on her left foot that she would like to have evaluated.  No other concerns   Past Medical History:  Diagnosis Date   Diabetes mellitus without complication (HCC)    Hypertension     Past Surgical History:  Procedure Laterality Date   c cection      Allergies  Allergen Reactions   Fluoxetine Hives   Sulfa Antibiotics      Objective:  Physical Exam General: Alert and oriented x3 in no acute distress  Dermatology: Hyperkeratotic lesion(s) present on the plantar aspect of the fifth MTP left. Pain on palpation with a central nucleated core noted. Skin is warm, dry and supple bilateral lower extremities. Negative for open lesions or macerations.  Vascular: Palpable pedal pulses bilaterally. No edema or erythema noted. Capillary refill within normal limits.  Neurological: Epicritic and protective threshold grossly intact bilaterally.   Musculoskeletal Exam: Pain on palpation at the keratotic lesion(s) noted. Range of motion within normal limits bilateral. Muscle strength 5/5 in all groups bilateral.  Assessment: 1.  Symptomatic callus; benign skin lesion plantar fifth MTP left 2.  Encounter for diabetic foot exam   Plan of Care:  1. Patient evaluated.  Comprehensive diabetic foot exam performed today 2. Excisional debridement of keratoic lesion(s) using a chisel blade was performed without incident.  3. Dressed area with light dressing. 4. Patient is to return to the clinic PRN.   Felecia Shelling, DPM [facility]  Dr. Felecia Shelling, DPM    [address] [place], [place] [address]                Office [phone]  Fax [phone]

## 2023-10-31 ENCOUNTER — Encounter: Payer: Self-pay | Admitting: Podiatry

## 2023-10-31 ENCOUNTER — Ambulatory Visit: Admitting: Podiatry

## 2023-10-31 VITALS — Ht 65.0 in | Wt 250.0 lb

## 2023-10-31 DIAGNOSIS — M7752 Other enthesopathy of left foot: Secondary | ICD-10-CM | POA: Diagnosis not present

## 2023-10-31 NOTE — Progress Notes (Signed)
   Chief Complaint  Patient presents with   Callouses    Pt is here due to callous on the bottom of her left foot that is bothering her.    HPI: 58 y.o. female presenting today for evaluation of pain and tenderness associated to the fifth MTP of the left foot.  Chronic.  No history of injury.  Over time she has developed a callus to the  plantar aspect of the fifth MTP.  She does admit to walking around the house barefoot  Past Medical History:  Diagnosis Date   Diabetes mellitus without complication (HCC)    Hypertension     Past Surgical History:  Procedure Laterality Date   c cection      Allergies  Allergen Reactions   Fluoxetine Hives   Sulfa Antibiotics      Physical Exam: General: The patient is alert and oriented x3 in no acute distress.  Dermatology: Skin is warm, dry and supple bilateral lower extremities.  Hyperkeratotic preulcerative callus noted plantar aspect of the fifth MTP left foot  Vascular: Palpable pedal pulses bilaterally. Capillary refill within normal limits.  No appreciable edema.  No erythema.  Neurological: Grossly intact via light touch  Musculoskeletal Exam: Mild tenderness with palpation to the fifth MTP of the left foot.  Exacerbated with weightbearing  Assessment/Plan of Care: 1.  Fifth MTP capsulitis left 2.  Symptomatic callus plantar aspect of the fifth MTP left 3.  Diabetes mellitus with peripheral polyneuropathy  -Patient evaluated -Excisional debridement of the hyperkeratotic preulcerative callus was performed today using a 312 scalpel -Stressed the importance of not going barefoot around the house.  Recommend good supportive tennis shoes or slides at provide arch support and alleviate pressure from the forefoot.  Explained that this should help alleviate pressure from the fifth MTP and alleviate a lot of her symptoms over time -Recommended the shoe market on [address] [employer] [address]. -Return to clinic PRN     Thresa EMERSON Sar, DPM [facility]  Dr. Thresa EMERSON Sar, DPM    [address] [place], [place] [address]                Office [phone]  Fax [phone]
